# Patient Record
Sex: Male | Born: 1985 | Race: Black or African American | Hispanic: No | Marital: Single | Smoking: Former smoker
Health system: Southern US, Community
[De-identification: ages and names within clinical notes are randomized; demographics above are authoritative.]

## PROBLEM LIST (undated history)

## (undated) HISTORY — PX: SKIN GRAFT: SHX250

---

## 2014-11-22 ENCOUNTER — Encounter (HOSPITAL_COMMUNITY): Payer: Self-pay | Admitting: Emergency Medicine

## 2014-11-22 ENCOUNTER — Emergency Department (HOSPITAL_COMMUNITY)
Admission: EM | Admit: 2014-11-22 | Discharge: 2014-11-22 | Disposition: A | Discharge status: Home / Self Care | Payer: BLUE CROSS/BLUE SHIELD | Attending: Emergency Medicine | Admitting: Emergency Medicine

## 2014-11-22 ENCOUNTER — Emergency Department (HOSPITAL_COMMUNITY): Payer: BLUE CROSS/BLUE SHIELD

## 2014-11-22 DIAGNOSIS — S61219A Laceration without foreign body of unspecified finger without damage to nail, initial encounter: Secondary | ICD-10-CM

## 2014-11-22 DIAGNOSIS — Y9389 Activity, other specified: Secondary | ICD-10-CM | POA: Diagnosis not present

## 2014-11-22 DIAGNOSIS — Y9289 Other specified places as the place of occurrence of the external cause: Secondary | ICD-10-CM | POA: Diagnosis not present

## 2014-11-22 DIAGNOSIS — S61217A Laceration without foreign body of left little finger without damage to nail, initial encounter: Secondary | ICD-10-CM | POA: Insufficient documentation

## 2014-11-22 DIAGNOSIS — W231XXA Caught, crushed, jammed, or pinched between stationary objects, initial encounter: Secondary | ICD-10-CM | POA: Insufficient documentation

## 2014-11-22 DIAGNOSIS — Z23 Encounter for immunization: Secondary | ICD-10-CM | POA: Insufficient documentation

## 2014-11-22 DIAGNOSIS — S6992XA Unspecified injury of left wrist, hand and finger(s), initial encounter: Secondary | ICD-10-CM | POA: Diagnosis present

## 2014-11-22 DIAGNOSIS — Z720 Tobacco use: Secondary | ICD-10-CM | POA: Diagnosis not present

## 2014-11-22 DIAGNOSIS — Y998 Other external cause status: Secondary | ICD-10-CM | POA: Insufficient documentation

## 2014-11-22 MED ORDER — LIDOCAINE HCL 1 % IJ SOLN
INTRAMUSCULAR | Status: AC
  Administered 2014-11-22: 20 mL via INTRADERMAL
  Filled 2014-11-22: qty 20

## 2014-11-22 MED ORDER — CEPHALEXIN 500 MG PO CAPS: 500.0000 mg | ORAL_CAPSULE | Freq: Four times a day (QID) | ORAL | Status: DC

## 2014-11-22 MED ORDER — TETANUS-DIPHTH-ACELL PERTUSSIS 5-2.5-18.5 LF-MCG/0.5 IM SUSP
0.5000 mL | Freq: Once | INTRAMUSCULAR | Status: AC
  Administered 2014-11-22: 0.5 mL via INTRAMUSCULAR
  Filled 2014-11-22: qty 0.5

## 2014-11-22 MED ORDER — LIDOCAINE HCL 1 % IJ SOLN
30.0000 mL | Freq: Once | INTRAMUSCULAR | Status: AC
  Administered 2014-11-22: 20 mL via INTRADERMAL

## 2014-11-22 NOTE — Discharge Instructions (Signed)

## 2014-11-22 NOTE — ED Notes (Signed)
Pt states he slammed his pinky finger on his left hand in a door  Pt states it busted it open and now he has an open wound noted  Pt has finger bandaged up  Did not unwrap in triage

## 2014-11-22 NOTE — ED Provider Notes (Signed)
CSN: [id number]     Arrival date & time 11/22/14  0356 History   First MD Initiated Contact with Patient 11/22/14 0505     Chief Complaint  Patient presents with  . Finger Injury     (Consider location/radiation/quality/duration/timing/severity/associated sxs/prior Treatment) Patient is a 29 y.o. male presenting with hand pain.  Hand Pain This is a new problem. The current episode started 1 to 2 hours ago. The problem occurs constantly. The problem has not changed since onset.Pertinent negatives include no chest pain, no abdominal pain, no headaches and no shortness of breath. The symptoms are aggravated by bending (touching). Nothing relieves the symptoms.    History reviewed. No pertinent past medical history. Past Surgical History  Procedure Laterality Date  . Skin graft     Family History  Problem Relation Age of Onset  . Diabetes Other   . Hypertension Other   . CAD Other    History  Substance Use Topics  . Smoking status: Current Every Day Smoker    Types: Cigarettes  . Smokeless tobacco: Not on file  . Alcohol Use: Yes     Comment: rare    Review of Systems  Respiratory: Negative for shortness of breath.   Cardiovascular: Negative for chest pain.  Gastrointestinal: Negative for abdominal pain.  Neurological: Negative for headaches.  All other systems reviewed and are negative.     Allergies  Review of patient's allergies indicates no known allergies.  Home Medications   Prior to Admission medications   Medication Sig Start Date End Date Taking? Authorizing Provider  cephALEXin (KEFLEX) 500 MG capsule Take 1 capsule (500 mg total) by mouth 4 (four) times daily. 11/22/14   Mirian MoMatthew Gentry, MD   BP 148/97 mmHg  Pulse 80  Temp(Src) 98.8 F (37.1 C) (Oral)  Resp 18  Ht 6\' 1"  (1.854 m)  Wt 280 lb (127.007 kg)  BMI 36.95 kg/m2  SpO2 100% Physical Exam  Constitutional: He is oriented to person, place, and time. He appears well-developed and well-nourished.   HENT:  Head: Normocephalic and atraumatic.  Eyes: Conjunctivae and EOM are normal.  Neck: Normal range of motion. Neck supple.  Cardiovascular: Normal rate, regular rhythm and normal heart sounds.   Pulmonary/Chest: Effort normal and breath sounds normal. No respiratory distress.  Abdominal: He exhibits no distension. There is no tenderness. There is no rebound and no guarding.  Musculoskeletal: Normal range of motion.       Left hand: He exhibits laceration (l fifth distal phalanx palmar aspect 1 cm).  Neurological: He is alert and oriented to person, place, and time.  Skin: Skin is warm and dry.  Vitals reviewed.   ED Course  LACERATION REPAIR Date/Time: 11/22/2014 6:56 AM Performed by: Mirian MoGENTRY, MATTHEW Authorized by: Mirian MoGENTRY, MATTHEW Consent: Verbal consent obtained. Body area: upper extremity Location details: left small finger Laceration length: 1 cm Tendon involvement: none Nerve involvement: none Vascular damage: no Anesthesia: digital block Local anesthetic: lidocaine 1% without epinephrine Irrigation solution: saline Irrigation method: syringe Amount of cleaning: standard Debridement: none Degree of undermining: none Skin closure: 3-0 Prolene Number of sutures: 3 Technique: simple Approximation: close Approximation difficulty: simple Patient tolerance: Patient tolerated the procedure well with no immediate complications   (including critical care time) Labs Review Labs Reviewed - No data to display  Imaging Review Dg Finger Little Left  11/22/2014   CLINICAL DATA:  Closed left fifth finger in car door, with laceration at the distal tuft. Initial encounter.  EXAM: LEFT LITTLE  FINGER 2+V  COMPARISON:  None.  FINDINGS: There is no evidence of fracture or dislocation. Visualized osseous structures appear grossly intact. Visualized joint spaces are preserved. Soft tissue swelling is noted at the distal aspect of the fifth digit. No radiopaque foreign bodies are seen.   IMPRESSION: No evidence of fracture or dislocation.   Electronically Signed   By: Roanna Raider M.D.   On: 11/22/2014 05:13     EKG Interpretation None      MDM   Final diagnoses:  Finger laceration, initial encounter    29 y.o. male without pertinent PMH presents with L 5th finger injury as above from slamming finger in his door.  Physical exam as above, no tendinous, vascular, or neurologic dysfunction.  Repaired uneventfully.  Standard return precautions given.    I have reviewed all laboratory and imaging studies if ordered as above  1. Finger laceration, initial encounter         Mirian Mo, MD 11/22/14 [phone]

## 2017-02-05 ENCOUNTER — Encounter: Payer: Self-pay | Admitting: Emergency Medicine

## 2017-02-05 ENCOUNTER — Ambulatory Visit (INDEPENDENT_AMBULATORY_CARE_PROVIDER_SITE_OTHER): Payer: BLUE CROSS/BLUE SHIELD | Admitting: Emergency Medicine

## 2017-02-05 VITALS — BP 140/80 | HR 71 | Temp 98.8°F | Resp 16 | Ht 72.0 in | Wt 290.0 lb

## 2017-02-05 DIAGNOSIS — Z0000 Encounter for general adult medical examination without abnormal findings: Secondary | ICD-10-CM | POA: Diagnosis not present

## 2017-02-05 LAB — COMPREHENSIVE METABOLIC PANEL
ALT: 33 U/L (ref 9–46)
AST: 29 U/L (ref 10–40)
Albumin: 3.8 g/dL (ref 3.6–5.1)
Alkaline Phosphatase: 88 U/L (ref 40–115)
BILIRUBIN TOTAL: 0.4 mg/dL (ref 0.2–1.2)
BUN: 10 mg/dL (ref 7–25)
CALCIUM: 9.4 mg/dL (ref 8.6–10.3)
CO2: 24 mmol/L (ref 20–31)
Chloride: 104 mmol/L (ref 98–110)
Creat: 0.95 mg/dL (ref 0.60–1.35)
Glucose, Bld: 84 mg/dL (ref 65–99)
Potassium: 4.5 mmol/L (ref 3.5–5.3)
Sodium: 140 mmol/L (ref 135–146)
Total Protein: 6.1 g/dL (ref 6.1–8.1)

## 2017-02-05 LAB — CBC WITH DIFFERENTIAL/PLATELET
BASOS ABS: 66 {cells}/uL (ref 0–200)
Basophils Relative: 1 %
EOS ABS: 726 {cells}/uL — AB (ref 15–500)
Eosinophils Relative: 11 %
HEMATOCRIT: 44.8 % (ref 38.5–50.0)
HEMOGLOBIN: 15.1 g/dL (ref 13.2–17.1)
LYMPHS ABS: 2838 {cells}/uL (ref 850–3900)
Lymphocytes Relative: 43 %
MCH: 28.5 pg (ref 27.0–33.0)
MCHC: 33.7 g/dL (ref 32.0–36.0)
MCV: 84.7 fL (ref 80.0–100.0)
MONO ABS: 594 {cells}/uL (ref 200–950)
MPV: 9.6 fL (ref 7.5–12.5)
Monocytes Relative: 9 %
Neutro Abs: 2376 cells/uL (ref 1500–7800)
Neutrophils Relative %: 36 %
Platelets: 259 10*3/uL (ref 140–400)
RBC: 5.29 MIL/uL (ref 4.20–5.80)
RDW: 14.2 % (ref 11.0–15.0)
WBC: 6.6 10*3/uL (ref 3.8–10.8)

## 2017-02-05 LAB — LIPID PANEL
CHOL/HDL RATIO: 7.4 ratio — AB (ref <5.0)
Cholesterol: 245 mg/dL — ABNORMAL HIGH (ref <200)
HDL: 33 mg/dL — ABNORMAL LOW (ref >40)
LDL Cholesterol: 134 mg/dL — ABNORMAL HIGH (ref <100)
Triglycerides: 388 mg/dL — ABNORMAL HIGH (ref <150)
VLDL: 78 mg/dL — ABNORMAL HIGH (ref <30)

## 2017-02-05 LAB — TSH: TSH: 1.15 mIU/L (ref 0.40–4.50)

## 2017-02-05 NOTE — Patient Instructions (Addendum)
   IF you received an x-ray today, you will receive an invoice from [facility]. Please contact [facility] at [phone] with questions or concerns regarding your invoice.   IF you received labwork today, you will receive an invoice from [facility]. Please contact [facility] at [phone] with questions or concerns regarding your invoice.   Our billing staff will not be able to assist you with questions regarding bills from these companies.  You will be contacted with the lab results as soon as they are available. The fastest way to get your results is to activate your My Chart account. Instructions are located on the last page of this paperwork. If you have not heard from us regarding the results in 2 weeks, please contact this office.      Health Maintenance, Male A healthy lifestyle and preventive care is important for your health and wellness. Ask your health care provider about what schedule of regular examinations is right for you. What should I know about weight and diet? Eat a Healthy Diet  Eat plenty of vegetables, fruits, whole grains, low-fat dairy products, and lean protein.  Do not eat a lot of foods high in solid fats, added sugars, or salt.  Maintain a Healthy Weight Regular exercise can help you achieve or maintain a healthy weight. You should:  Do at least 150 minutes of exercise each week. The exercise should increase your heart rate and make you sweat (moderate-intensity exercise).  Do strength-training exercises at least twice a week.  Watch Your Levels of Cholesterol and Blood Lipids  Have your blood tested for lipids and cholesterol every 5 years starting at 31 years of age. If you are at high risk for heart disease, you should start having your blood tested when you are 31 years old. You may need to have your cholesterol levels checked more often if: ? Your lipid or cholesterol levels are high. ? You are older than 31 years of age. ? You  are at high risk for heart disease.  What should I know about cancer screening? Many types of cancers can be detected early and may often be prevented. Lung Cancer  You should be screened every year for lung cancer if: ? You are a current smoker who has smoked for at least 30 years. ? You are a former smoker who has quit within the past 15 years.  Talk to your health care provider about your screening options, when you should start screening, and how often you should be screened.  Colorectal Cancer  Routine colorectal cancer screening usually begins at 31 years of age and should be repeated every 5-10 years until you are 31 years old. You may need to be screened more often if early forms of precancerous polyps or small growths are found. Your health care provider may recommend screening at an earlier age if you have risk factors for colon cancer.  Your health care provider may recommend using home test kits to check for hidden blood in the stool.  A small camera at the end of a tube can be used to examine your colon (sigmoidoscopy or colonoscopy). This checks for the earliest forms of colorectal cancer.  Prostate and Testicular Cancer  Depending on your age and overall health, your health care provider may do certain tests to screen for prostate and testicular cancer.  Talk to your health care provider about any symptoms or concerns you have about testicular or prostate cancer.  Skin Cancer  Check your skin   from head to toe regularly.  Tell your health care provider about any new moles or changes in moles, especially if: ? There is a change in a mole's size, shape, or color. ? You have a mole that is larger than a pencil eraser.  Always use sunscreen. Apply sunscreen liberally and repeat throughout the day.  Protect yourself by wearing long sleeves, pants, a wide-brimmed hat, and sunglasses when outside.  What should I know about heart disease, diabetes, and high blood  pressure?  If you are 18-39 years of age, have your blood pressure checked every 3-5 years. If you are 40 years of age or older, have your blood pressure checked every year. You should have your blood pressure measured twice-once when you are at a hospital or clinic, and once when you are not at a hospital or clinic. Record the average of the two measurements. To check your blood pressure when you are not at a hospital or clinic, you can use: ? An automated blood pressure machine at a pharmacy. ? A home blood pressure monitor.  Talk to your health care provider about your target blood pressure.  If you are between 45-79 years old, ask your health care provider if you should take aspirin to prevent heart disease.  Have regular diabetes screenings by checking your fasting blood sugar level. ? If you are at a normal weight and have a low risk for diabetes, have this test once every three years after the age of 45. ? If you are overweight and have a high risk for diabetes, consider being tested at a younger age or more often.  A one-time screening for abdominal aortic aneurysm (AAA) by ultrasound is recommended for men aged 65-75 years who are current or former smokers. What should I know about preventing infection? Hepatitis B If you have a higher risk for hepatitis B, you should be screened for this virus. Talk with your health care provider to find out if you are at risk for hepatitis B infection. Hepatitis C Blood testing is recommended for:  Everyone born from 1945 through 1965.  Anyone with known risk factors for hepatitis C.  Sexually Transmitted Diseases (STDs)  You should be screened each year for STDs including gonorrhea and chlamydia if: ? You are sexually active and are younger than 31 years of age. ? You are older than 31 years of age and your health care provider tells you that you are at risk for this type of infection. ? Your sexual activity has changed since you were last  screened and you are at an increased risk for chlamydia or gonorrhea. Ask your health care provider if you are at risk.  Talk with your health care provider about whether you are at high risk of being infected with HIV. Your health care provider may recommend a prescription medicine to help prevent HIV infection.  What else can I do?  Schedule regular health, dental, and eye exams.  Stay current with your vaccines (immunizations).  Do not use any tobacco products, such as cigarettes, chewing tobacco, and e-cigarettes. If you need help quitting, ask your health care provider.  Limit alcohol intake to no more than 2 drinks per day. One drink equals 12 ounces of beer, 5 ounces of wine, or 1 ounces of hard liquor.  Do not use street drugs.  Do not share needles.  Ask your health care provider for help if you need support or information about quitting drugs.  Tell your health care   provider if you often feel depressed.  Tell your health care provider if you have ever been abused or do not feel safe at home. This information is not intended to replace advice given to you by your health care provider. Make sure you discuss any questions you have with your health care provider. Document Released: 01/11/2008 Document Revised: 03/13/2016 Document Reviewed: 04/18/2015 Elsevier Interactive Patient Education  2018 [employer]  American Heart Association (AHA) Exercise Recommendation  Being physically active is important to prevent heart disease and stroke, the nation's No. 1and No. 5killers. To improve overall cardiovascular health, we suggest at least 150 minutes per week of moderate exercise or 75 minutes per week of vigorous exercise (or a combination of moderate and vigorous activity). Thirty minutes a day, five times a week is an easy goal to remember. You will also experience benefits even if you divide your time into two or three segments of 10 to 15 minutes per day.  For people who would  benefit from lowering their blood pressure or cholesterol, we recommend 40 minutes of aerobic exercise of moderate to vigorous intensity three to four times a week to lower the risk for heart attack and stroke.  Physical activity is anything that makes you move your body and burn calories.  This includes things like climbing stairs or playing sports. Aerobic exercises benefit your heart, and include walking, jogging, swimming or biking. Strength and stretching exercises are best for overall stamina and flexibility.  The simplest, positive change you can make to effectively improve your heart health is to start walking. It's enjoyable, free, easy, social and great exercise. A walking program is flexible and boasts high success rates because people can stick with it. It's easy for walking to become a regular and satisfying part of life.   For Overall Cardiovascular Health:  At least 30 minutes of moderate-intensity aerobic activity at least 5 days per week for a total of 150  OR   At least 25 minutes of vigorous aerobic activity at least 3 days per week for a total of 75 minutes; or a combination of moderate- and vigorous-intensity aerobic activity  AND   Moderate- to high-intensity muscle-strengthening activity at least 2 days per week for additional health benefits.  For Lowering Blood Pressure and Cholesterol  An average 40 minutes of moderate- to vigorous-intensity aerobic activity 3 or 4 times per week  What if I can't make it to the time goal? Something is always better than nothing! And everyone has to start somewhere. Even if you've been sedentary for years, today is the day you can begin to make healthy changes in your life. If you don't think you'll make it for 30 or 40 minutes, set a reachable goal for today. You can work up toward your overall goal by increasing your time as you get stronger. Don't let all-or-nothing thinking rob you of doing what you can every day.   Source:http://www.heart.org    

## 2017-02-05 NOTE — Progress Notes (Signed)
Celesta Gentile 31 y.o.   Chief Complaint  Patient presents with  . Annual Exam    HISTORY OF PRESENT ILLNESS: This is a 31 y.o. male here for annual exam; no complaints or medical concerns.  HPI   Prior to Admission medications   Not on File    Not on File  Patient Active Problem List   Diagnosis Date Noted  . Routine general medical examination at a health care facility 02/05/2017    No past medical history on file.  Past Surgical History:  Procedure Laterality Date  . SKIN GRAFT      Social History   Social History  . Marital status: Single    Spouse name: N/A  . Number of children: N/A  . Years of education: N/A   Occupational History  . Not on file.   Social History Main Topics  . Smoking status: Current Every Day Smoker    Packs/day: 1.00    Years: 8.00    Types: Cigarettes  . Smokeless tobacco: Never Used  . Alcohol use Yes     Comment: rare  . Drug use: Yes    Types: Marijuana     Comment: occ  . Sexual activity: Not on file   Other Topics Concern  . Not on file   Social History Narrative  . No narrative on file    Family History  Problem Relation Age of Onset  . Diabetes Other   . Hypertension Other   . CAD Other   . Diabetes Father   . Heart disease Father   . Hyperlipidemia Father   . Diabetes Maternal Grandmother   . Heart disease Maternal Grandmother   . Hyperlipidemia Maternal Grandmother   . Heart disease Paternal Grandfather      Review of Systems  Constitutional: Negative.  Negative for chills and fever.  HENT: Negative.  Negative for hearing loss, nosebleeds and sore throat.   Eyes: Negative.  Negative for blurred vision and double vision.  Respiratory: Negative.  Negative for cough and shortness of breath.   Cardiovascular: Negative.  Negative for chest pain, palpitations and leg swelling.  Gastrointestinal: Negative.  Negative for abdominal pain, diarrhea, nausea and vomiting.  Genitourinary:  Negative.  Negative for dysuria and hematuria.  Musculoskeletal: Negative.   Skin: Negative.  Negative for rash.  Neurological: Negative.  Negative for dizziness and headaches.  Endo/Heme/Allergies: Negative.   All other systems reviewed and are negative.  Vitals:   02/05/17 1211 02/05/17 1310  BP: (!) 170/99 140/80  Pulse: 71   Resp: 16   Temp: 98.8 F (37.1 C)     Physical Exam  Constitutional: He is oriented to person, place, and time. He appears well-developed and well-nourished.  HENT:  Head: Normocephalic and atraumatic.  Eyes: Conjunctivae and EOM are normal. Pupils are equal, round, and reactive to light.  Neck: Normal range of motion. Neck supple. No JVD present. No thyromegaly present.  Cardiovascular: Normal rate, regular rhythm, normal heart sounds and intact distal pulses.   Pulmonary/Chest: Effort normal and breath sounds normal.  Abdominal: Soft. Bowel sounds are normal. He exhibits no distension. There is no tenderness.  Musculoskeletal: Normal range of motion.  Lymphadenopathy:    He has no cervical adenopathy.  Neurological: He is alert and oriented to person, place, and time. He displays normal reflexes. No sensory deficit. He exhibits normal muscle tone.  Skin: Skin is warm and dry. Capillary refill takes less than 2 seconds. No rash noted.  Psychiatric: He has a normal mood and affect. His behavior is normal.  Vitals reviewed.    ASSESSMENT & PLAN: Quinlin was seen today for annual exam.  Diagnoses and all orders for this visit:  Routine general medical examination at a health care facility -     Cancel: CBC with Differential -     Cancel: Comprehensive metabolic panel -     Cancel: Hemoglobin A1c -     Cancel: Lipid panel -     Cancel: PSA(Must document that pt has been informed of limitations of PSA testing.) -     Cancel: TSH -     Cancel: HIV antibody -     CBC with Differential/Platelet -     Comprehensive metabolic panel -     Hemoglobin  A1c -     Lipid panel -     PSA -     TSH -     HIV antibody    Patient Instructions       IF you received an x-ray today, you will receive an invoice from [facility]. Please contact [facility] at [phone] with questions or concerns regarding your invoice.   IF you received labwork today, you will receive an invoice from [facility]. Please contact [facility] at [phone] with questions or concerns regarding your invoice.   Our billing staff will not be able to assist you with questions regarding bills from these companies.  You will be contacted with the lab results as soon as they are available. The fastest way to get your results is to activate your My Chart account. Instructions are located on the last page of this paperwork. If you have not heard from [place] regarding the results in 2 weeks, please contact this office.        Health Maintenance, Male A healthy lifestyle and preventive care is important for your health and wellness. Ask your health care provider about what schedule of regular examinations is right for you. What should I know about weight and diet? Eat a Healthy Diet  Eat plenty of vegetables, fruits, whole grains, low-fat dairy products, and lean protein.  Do not eat a lot of foods high in solid fats, added sugars, or salt.  Maintain a Healthy Weight Regular exercise can help you achieve or maintain a healthy weight. You should:  Do at least 150 minutes of exercise each week. The exercise should increase your heart rate and make you sweat (moderate-intensity exercise).  Do strength-training exercises at least twice a week.  Watch Your Levels of Cholesterol and Blood Lipids  Have your blood tested for lipids and cholesterol every 5 years starting at 31 years of age. If you are at high risk for heart disease, you should start having your blood tested when you are 31 years old. You may need to have your cholesterol levels checked more  often if: ? Your lipid or cholesterol levels are high. ? You are older than 31 years of age. ? You are at high risk for heart disease.  What should I know about cancer screening? Many types of cancers can be detected early and may often be prevented. Lung Cancer  You should be screened every year for lung cancer if: ? You are a current smoker who has smoked for at least 30 years. ? You are a former smoker who has quit within the past 15 years.  Talk to your health care provider about your screening options, when you should start screening, and how  often you should be screened.  Colorectal Cancer  Routine colorectal cancer screening usually begins at 31 years of age and should be repeated every 5-10 years until you are 31 years old. You may need to be screened more often if early forms of precancerous polyps or small growths are found. Your health care provider may recommend screening at an earlier age if you have risk factors for colon cancer.  Your health care provider may recommend using home test kits to check for hidden blood in the stool.  A small camera at the end of a tube can be used to examine your colon (sigmoidoscopy or colonoscopy). This checks for the earliest forms of colorectal cancer.  Prostate and Testicular Cancer  Depending on your age and overall health, your health care provider may do certain tests to screen for prostate and testicular cancer.  Talk to your health care provider about any symptoms or concerns you have about testicular or prostate cancer.  Skin Cancer  Check your skin from head to toe regularly.  Tell your health care provider about any new moles or changes in moles, especially if: ? There is a change in a mole's size, shape, or color. ? You have a mole that is larger than a pencil eraser.  Always use sunscreen. Apply sunscreen liberally and repeat throughout the day.  Protect yourself by wearing long sleeves, pants, a wide-brimmed hat, and  sunglasses when outside.  What should I know about heart disease, diabetes, and high blood pressure?  If you are 71-49 years of age, have your blood pressure checked every 3-5 years. If you are 62 years of age or older, have your blood pressure checked every year. You should have your blood pressure measured twice-once when you are at a hospital or clinic, and once when you are not at a hospital or clinic. Record the average of the two measurements. To check your blood pressure when you are not at a hospital or clinic, you can use: ? An automated blood pressure machine at a pharmacy. ? A home blood pressure monitor.  Talk to your health care provider about your target blood pressure.  If you are between 75-72 years old, ask your health care provider if you should take aspirin to prevent heart disease.  Have regular diabetes screenings by checking your fasting blood sugar level. ? If you are at a normal weight and have a low risk for diabetes, have this test once every three years after the age of 68. ? If you are overweight and have a high risk for diabetes, consider being tested at a younger age or more often.  A one-time screening for abdominal aortic aneurysm (AAA) by ultrasound is recommended for men aged 65-75 years who are current or former smokers. What should I know about preventing infection? Hepatitis B If you have a higher risk for hepatitis B, you should be screened for this virus. Talk with your health care provider to find out if you are at risk for hepatitis B infection. Hepatitis C Blood testing is recommended for:  Everyone born from [birth year] through 1965.  Anyone with known risk factors for hepatitis C.  Sexually Transmitted Diseases (STDs)  You should be screened each year for STDs including gonorrhea and chlamydia if: ? You are sexually active and are younger than 31 years of age. ? You are older than 31 years of age and your health care provider tells you that you are  at risk for this type of  infection. ? Your sexual activity has changed since you were last screened and you are at an increased risk for chlamydia or gonorrhea. Ask your health care provider if you are at risk.  Talk with your health care provider about whether you are at high risk of being infected with HIV. Your health care provider may recommend a prescription medicine to help prevent HIV infection.  What else can I do?  Schedule regular health, dental, and eye exams.  Stay current with your vaccines (immunizations).  Do not use any tobacco products, such as cigarettes, chewing tobacco, and e-cigarettes. If you need help quitting, ask your health care provider.  Limit alcohol intake to no more than 2 drinks per day. One drink equals 12 ounces of beer, 5 ounces of wine, or 1 ounces of hard liquor.  Do not use street drugs.  Do not share needles.  Ask your health care provider for help if you need support or information about quitting drugs.  Tell your health care provider if you often feel depressed.  Tell your health care provider if you have ever been abused or do not feel safe at home. This information is not intended to replace advice given to you by your health care provider. Make sure you discuss any questions you have with your health care provider. Document Released: 01/11/2008 Document Revised: 03/13/2016 Document Reviewed: 04/18/2015 Elsevier Interactive Patient Education  2018 [employer].  American Heart Association (AHA) Exercise Recommendation  Being physically active is important to prevent heart disease and stroke, the nation's No. 1and No. 5killers. To improve overall cardiovascular health, we suggest at least 150 minutes per week of moderate exercise or 75 minutes per week of vigorous exercise (or a combination of moderate and vigorous activity). Thirty minutes a day, five times a week is an easy goal to remember. You will also experience benefits even if you  divide your time into two or three segments of 10 to 15 minutes per day.  For people who would benefit from lowering their blood pressure or cholesterol, we recommend 40 minutes of aerobic exercise of moderate to vigorous intensity three to four times a week to lower the risk for heart attack and stroke.  Physical activity is anything that makes you move your body and burn calories.  This includes things like climbing stairs or playing sports. Aerobic exercises benefit your heart, and include walking, jogging, swimming or biking. Strength and stretching exercises are best for overall stamina and flexibility.  The simplest, positive change you can make to effectively improve your heart health is to start walking. It's enjoyable, free, easy, social and great exercise. A walking program is flexible and boasts high success rates because people can stick with it. It's easy for walking to become a regular and satisfying part of life.   For Overall Cardiovascular Health:  At least 30 minutes of moderate-intensity aerobic activity at least 5 days per week for a total of 150  OR   At least 25 minutes of vigorous aerobic activity at least 3 days per week for a total of 75 minutes; or a combination of moderate- and vigorous-intensity aerobic activity  AND   Moderate- to high-intensity muscle-strengthening activity at least 2 days per week for additional health benefits.  For Lowering Blood Pressure and Cholesterol  An average 40 minutes of moderate- to vigorous-intensity aerobic activity 3 or 4 times per week  What if I can't make it to the time goal? Something is always better than nothing!  And everyone has to start somewhere. Even if you've been sedentary for years, today is the day you can begin to make healthy changes in your life. If you don't think you'll make it for 30 or 40 minutes, set a reachable goal for today. You can work up toward your overall goal by increasing your time as you get  stronger. Don't let all-or-nothing thinking rob you of doing what you can every day.  Source:http://www.heart.[place]        Jasmia Angst, MD [facility]

## 2017-02-06 ENCOUNTER — Telehealth: Payer: Self-pay | Admitting: *Deleted

## 2017-02-06 ENCOUNTER — Other Ambulatory Visit: Payer: Self-pay | Admitting: Emergency Medicine

## 2017-02-06 LAB — HEMOGLOBIN A1C
Hgb A1c MFr Bld: 5.9 % — ABNORMAL HIGH (ref <5.7)
Mean Plasma Glucose: 123 mg/dL

## 2017-02-06 LAB — HIV ANTIBODY (ROUTINE TESTING W REFLEX): HIV: NONREACTIVE

## 2017-02-06 LAB — PSA: PSA: 0.3 ng/mL (ref <=4.0)

## 2017-02-06 MED ORDER — PRAVASTATIN SODIUM 40 MG PO TABS: 40.0000 mg | ORAL_TABLET | Freq: Every day | ORAL | 3 refills | Status: DC

## 2017-02-06 MED ORDER — PRAVASTATIN SODIUM 40 MG PO TABS: 40.0000 mg | ORAL_TABLET | Freq: Every day | ORAL | 3 refills | Status: AC

## 2017-02-06 NOTE — Telephone Encounter (Signed)
Called patient advised RX for pravastatin 40 mg [facility]

## 2017-02-06 NOTE — Addendum Note (Signed)
Addended by: Evie LacksSAGARDIA, Jadien Lehigh J on: 02/06/2017 11:23 AM   Modules accepted: Orders

## 2017-12-24 ENCOUNTER — Ambulatory Visit
Admission: RE | Admit: 2017-12-24 | Discharge: 2017-12-24 | Disposition: A | Discharge status: Home / Self Care | Payer: 59 | Source: Ambulatory Visit | Attending: Physical Medicine and Rehabilitation | Admitting: Physical Medicine and Rehabilitation

## 2017-12-24 ENCOUNTER — Other Ambulatory Visit: Payer: Self-pay | Admitting: Physical Medicine and Rehabilitation

## 2017-12-24 DIAGNOSIS — E871 Hypo-osmolality and hyponatremia: Secondary | ICD-10-CM

## 2018-11-12 IMAGING — CR DG CHEST 1V
1 series · 1 of 1 positions shown · non-contrast
Comparison: None.

CLINICAL DATA: Sodium deficiency

EXAM:
CHEST  1 VIEW

[w chest pa]
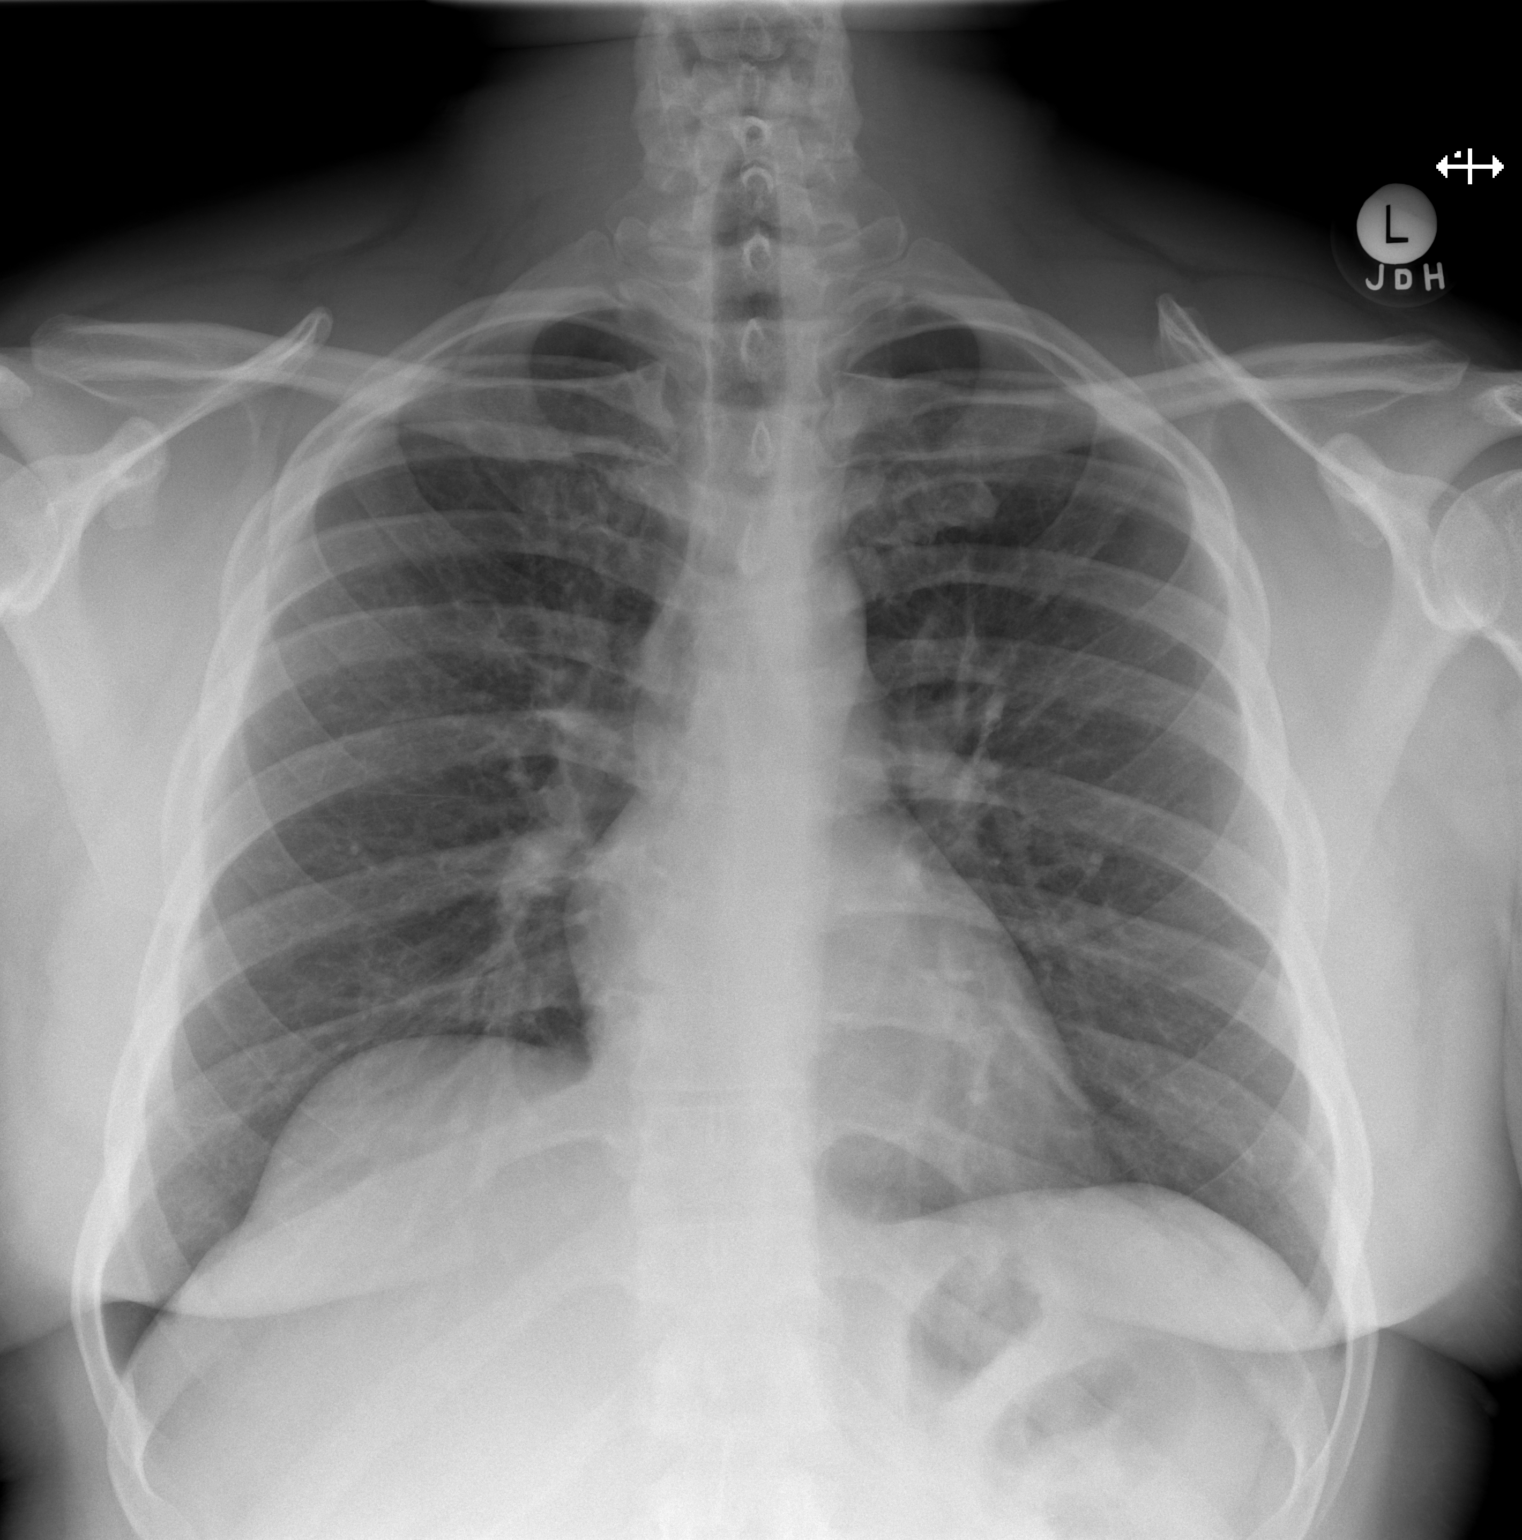

[1 of 1 positions shown; findings below may reference images not displayed]

FINDINGS: The heart size and mediastinal contours are within normal limits.
Both lungs are clear. The visualized skeletal structures are
unremarkable.
IMPRESSION: No active disease.

## 2020-01-10 ENCOUNTER — Ambulatory Visit: Payer: Self-pay

## 2020-01-10 ENCOUNTER — Other Ambulatory Visit: Payer: Self-pay

## 2020-01-10 ENCOUNTER — Other Ambulatory Visit: Payer: Self-pay | Admitting: Family Medicine

## 2020-01-10 DIAGNOSIS — M25512 Pain in left shoulder: Secondary | ICD-10-CM

## 2020-06-26 ENCOUNTER — Ambulatory Visit: Payer: Self-pay

## 2020-06-26 NOTE — Telephone Encounter (Signed)
BP noted this am: 187/"?" Rechecked BP 211/139-5 minutes before call. Woke up with headache this am. Was coughing all night. Pt seen at [facility] and BP was elevated there.   Pt is not on any meds for HTN and and has had headache intermittently  for months. Denies any weakness, numbness, any vision changes, chest pain or dizziness. [facility] advised pt to call back with BP reading and they will prescribe med for HTN. Advised pt to get PCP. [facility] number given to pt. Care advice given to pt and pt verbalized understanding.  Reason for Disposition . [1] Systolic BP  >= 200 OR Diastolic >= 120  AND [2] having NO cardiac or neurologic symptoms    H/o intermittent headaches- none at time of call took Excedrin migraine  Answer Assessment - Initial Assessment Questions 1. BLOOD PRESSURE: "What is the blood pressure?" "Did you take at least two measurements 5 minutes apart?"     211/139 2. ONSET: "When did you take your blood pressure?"     1010 3. HOW: "How did you obtain the blood pressure?" (e.g., visiting nurse, automatic home BP monitor)     Auto hoe BP cuff 4. HISTORY: "Do you have a history of high blood pressure?"     yes 5. MEDICATIONS: "Are you taking any medications for blood pressure?" "Have you missed any doses recently?"     no 6. OTHER SYMPTOMS: "Do you have any symptoms?" (e.g., headache, chest pain, blurred vision, difficulty breathing, weakness)     Fatigue, heachache 7. PREGNANCY: "Is there any chance you are pregnant?" "When was your last menstrual period?"     n/a  Protocols used: BLOOD PRESSURE - HIGH-A-AH

## 2020-07-03 DIAGNOSIS — I152 Hypertension secondary to endocrine disorders: Secondary | ICD-10-CM | POA: Insufficient documentation

## 2020-07-05 DIAGNOSIS — E1169 Type 2 diabetes mellitus with other specified complication: Secondary | ICD-10-CM | POA: Insufficient documentation

## 2020-07-10 DIAGNOSIS — E1165 Type 2 diabetes mellitus with hyperglycemia: Secondary | ICD-10-CM | POA: Insufficient documentation

## 2020-07-10 DIAGNOSIS — E559 Vitamin D deficiency, unspecified: Secondary | ICD-10-CM | POA: Insufficient documentation

## 2020-11-28 IMAGING — DX DG SHOULDER 2+V*L*
3 series · 3 of 3 positions shown · non-contrast
Comparison: None.

CLINICAL DATA: 33-year-old male left shoulder pain, strain.

EXAM:
LEFT SHOULDER - 2+ VIEW

[shoulder ap]
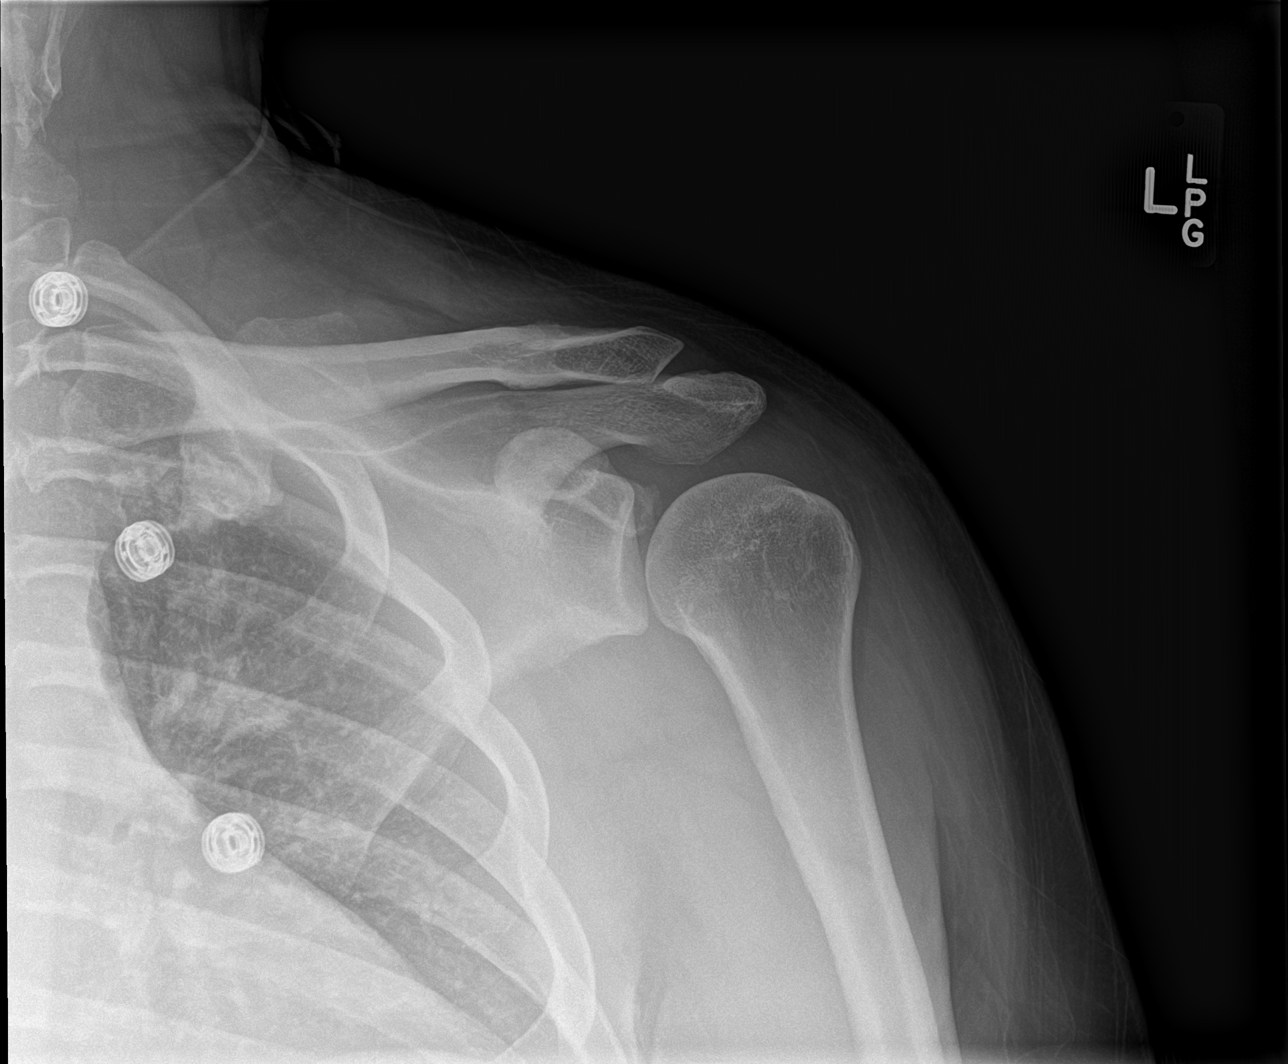

[shoulder y-view]
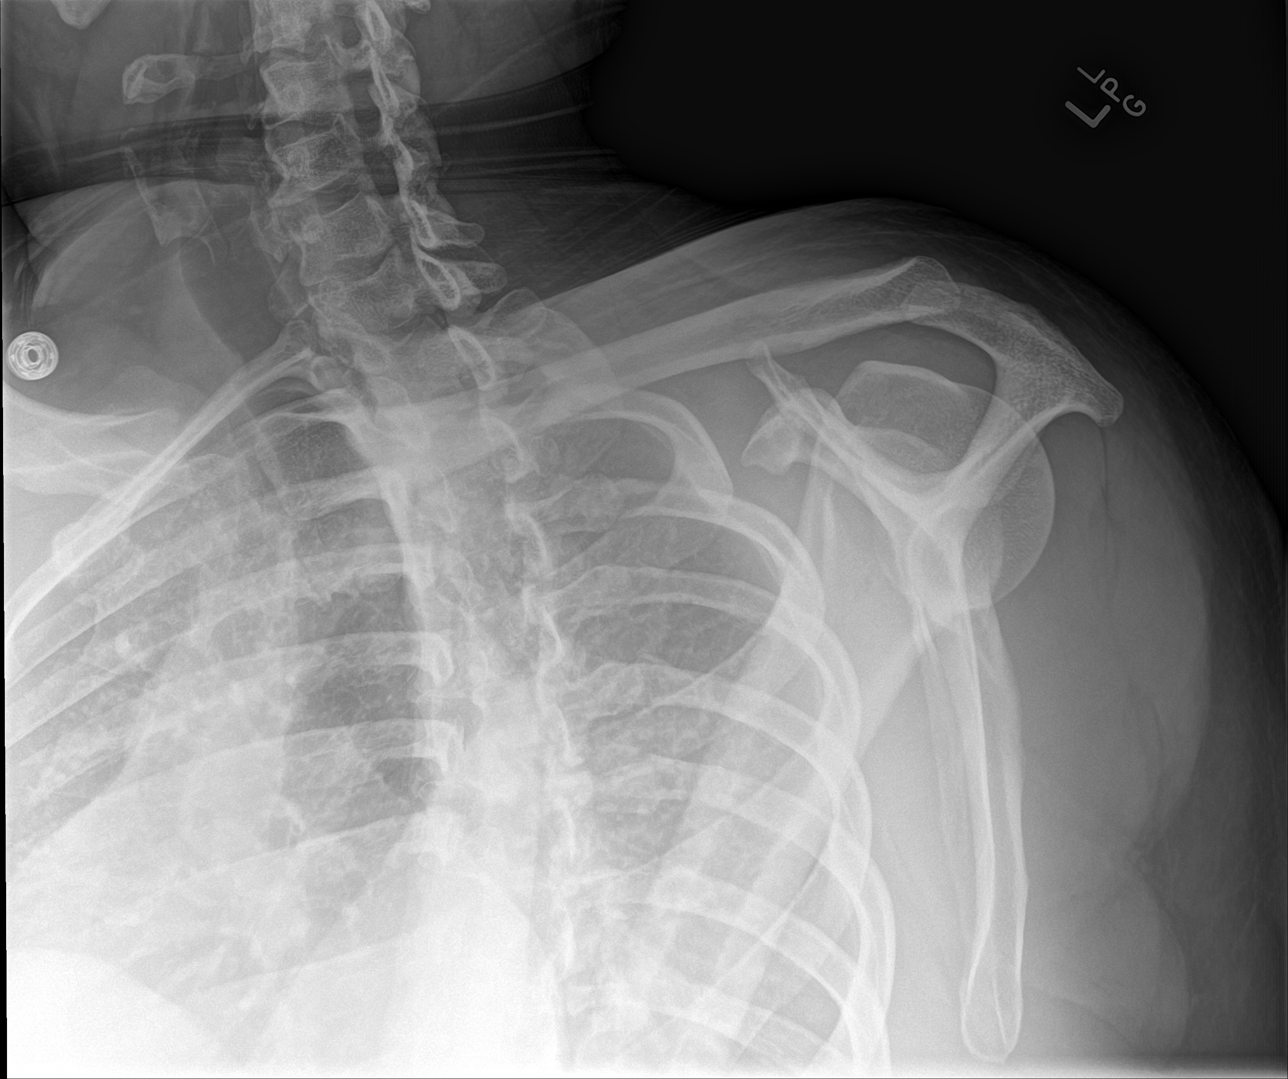

[shoulder axial]
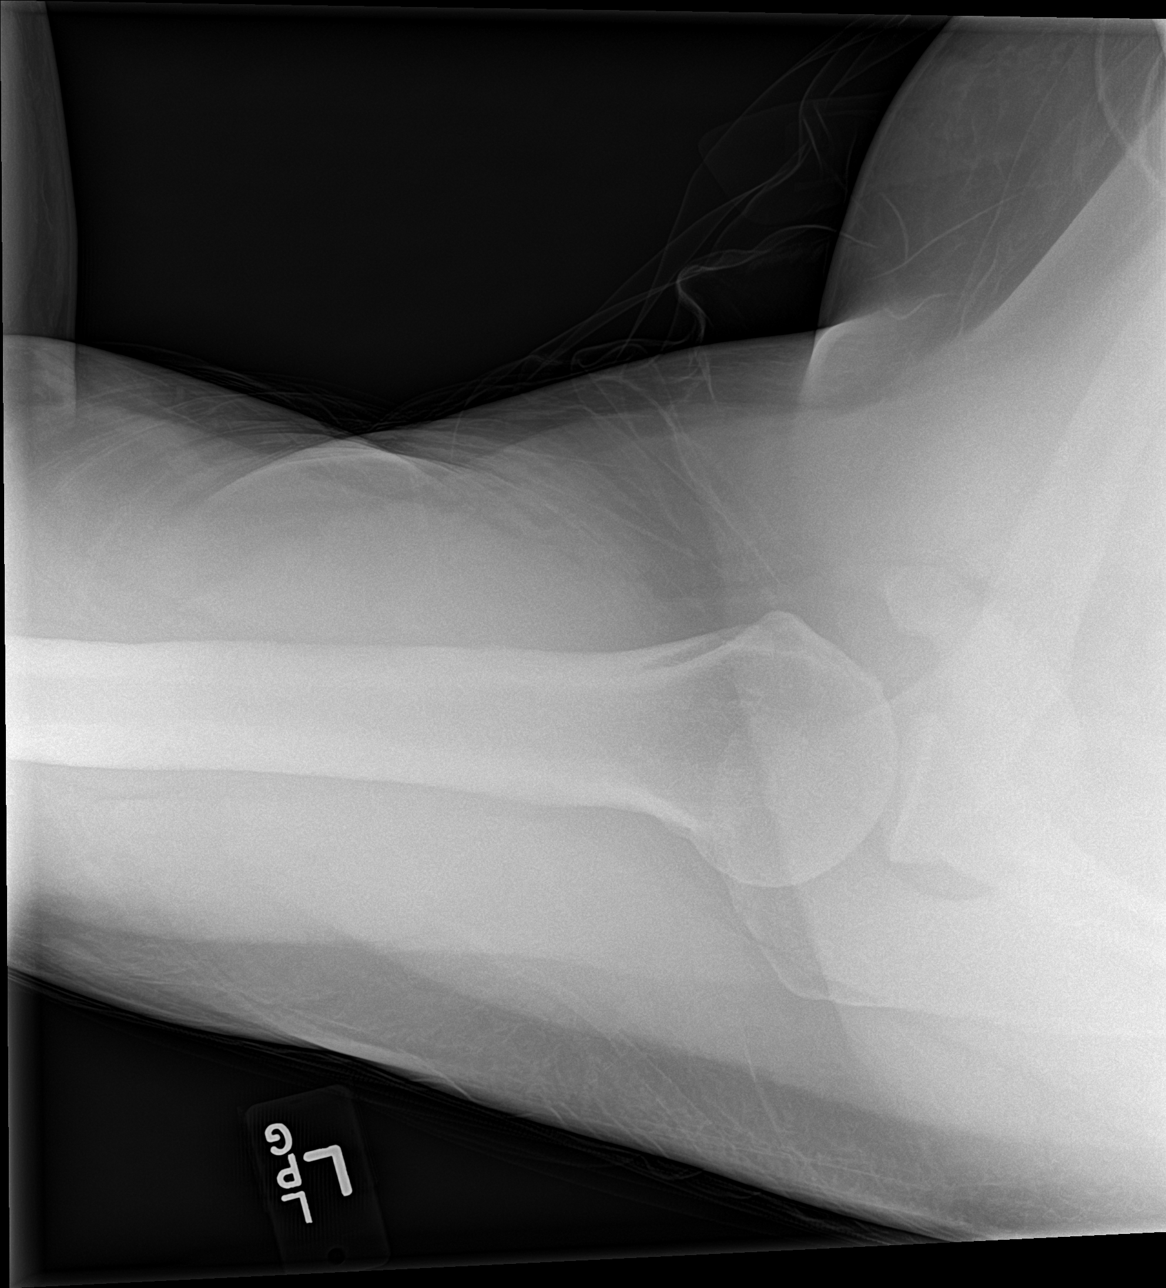

[3 of 3 positions shown; findings below may reference images not displayed]

FINDINGS: Bone mineralization is within normal limits. There is no evidence of
fracture or dislocation. There is no evidence of arthropathy or
other focal bone abnormality. Negative visible left ribs and chest.
IMPRESSION: Negative.

## 2022-11-01 ENCOUNTER — Institutional Professional Consult (permissible substitution): Payer: 59 | Admitting: Primary Care

## 2022-11-22 ENCOUNTER — Ambulatory Visit (INDEPENDENT_AMBULATORY_CARE_PROVIDER_SITE_OTHER): Payer: 59 | Admitting: Primary Care

## 2022-11-22 ENCOUNTER — Encounter: Payer: Self-pay | Admitting: Primary Care

## 2022-11-22 VITALS — BP 134/84 | HR 82 | Temp 99.2°F | Ht 73.0 in | Wt 290.6 lb

## 2022-11-22 DIAGNOSIS — R0683 Snoring: Secondary | ICD-10-CM

## 2022-11-22 NOTE — Assessment & Plan Note (Signed)
-   Patient has symptoms of loud snoring and witnessed apnea.  Epworth score is 10.  BMI 38.  Concern patient has underlying obstructive sleep apnea, needs home sleep study to evaluate.  We reviewed risks of untreated sleep apnea including cardiac arrhythmias, pulmonary hypertension, diabetes and stroke.  We discussed treatment options including weight loss, oral appliance, CPAP therapy or referral to ENT for possible surgical options.  Encourage side sleeping position.  Advised against alcohol use prior to bedtime as this can worsen sleep apnea.  Follow-up 1 to 2 weeks after sleep study review results and treatment options if needed.

## 2022-11-22 NOTE — Patient Instructions (Addendum)
Sleep apnea is defined as period of 10 seconds or longer when you stop breathing at night. This can happen multiple times a night. Dx sleep apnea is when this occurs more than 5 times an hour.    Mild OSA 5-15 apneic events an hour Moderate OSA 15-30 apneic events an hour Severe OSA > 30 apneic events an hour   Untreated sleep apnea puts you at higher risk for cardiac arrhythmias, pulmonary HTN, stroke and diabetes  Treatment options include weight loss, side sleeping position, oral appliance, CPAP therapy or referral to ENT for possible surgical options    Recommendations: Focus on side sleeping position or elevate head with wedge pillow 30 degrees Work on weight loss efforts if able  Do not drive if experiencing excessive daytime sleepiness of fatigue    Orders: Home sleep study re: loud snoring    Follow-up: Please call to schedule follow-up 1-2 weeks after completing home sleep study to review results and treatment if needed (can be virtual)  Sleep Apnea Sleep apnea affects breathing during sleep. It causes breathing to stop for 10 seconds or more, or to become shallow. People with sleep apnea usually snore loudly. It can also increase the risk of: Heart attack. Stroke. Being very overweight (obese). Diabetes. Heart failure. Irregular heartbeat. High blood pressure. The goal of treatment is to help you breathe normally again. What are the causes?  The most common cause of this condition is a collapsed or blocked airway. There are three kinds of sleep apnea: Obstructive sleep apnea. This is caused by a blocked or collapsed airway. Central sleep apnea. This happens when the brain does not send the right signals to the muscles that control breathing. Mixed sleep apnea. This is a combination of obstructive and central sleep apnea. What increases the risk? Being overweight. Smoking. Having a small airway. Being older. Being male. Drinking alcohol. Taking medicines to  calm yourself (sedatives or tranquilizers). Having family members with the condition. Having a tongue or tonsils that are larger than normal. What are the signs or symptoms? Trouble staying asleep. Loud snoring. Headaches in the morning. Waking up gasping. Dry mouth or sore throat in the morning. Being sleepy or tired during the day. If you are sleepy or tired during the day, you may also: Not be able to focus your mind (concentrate). Forget things. Get angry a lot and have mood swings. Feel sad (depressed). Have changes in your personality. Have less interest in sex, if you are male. Be unable to have an erection, if you are male. How is this treated?  Sleeping on your side. Using a medicine to get rid of mucus in your nose (decongestant). Avoiding the use of alcohol, medicines to help you relax, or certain pain medicines (narcotics). Losing weight, if needed. Changing your diet. Quitting smoking. Using a machine to open your airway while you sleep, such as: An oral appliance. This is a mouthpiece that shifts your lower jaw forward. A CPAP device. This device blows air through a mask when you breathe out (exhale). An EPAP device. This has valves that you put in each nostril. A BIPAP device. This device blows air through a mask when you breathe in (inhale) and breathe out. Having surgery if other treatments do not work. Follow these instructions at home: Lifestyle Make changes that your doctor recommends. Eat a healthy diet. Lose weight if needed. Avoid alcohol, medicines to help you relax, and some pain medicines. Do not smoke or use any products that contain   nicotine or tobacco. If you need help quitting, ask your doctor. General instructions Take over-the-counter and prescription medicines only as told by your doctor. If you were given a machine to use while you sleep, use it only as told by your doctor. If you are having surgery, make sure to tell your doctor you have  sleep apnea. You may need to bring your device with you. Keep all follow-up visits. Contact a doctor if: The machine that you were given to use during sleep bothers you or does not seem to be working. You do not get better. You get worse. Get help right away if: Your chest hurts. You have trouble breathing in enough air. You have an uncomfortable feeling in your back, arms, or stomach. You have trouble talking. One side of your body feels weak. A part of your face is hanging down. These symptoms may be an emergency. Get help right away. Call your local emergency services (911 in the [place] Do not wait to see if the symptoms will go away. Do not drive yourself to the hospital. Summary This condition affects breathing during sleep. The most common cause is a collapsed or blocked airway. The goal of treatment is to help you breathe normally while you sleep. This information is not intended to replace advice given to you by your health care provider. Make sure you discuss any questions you have with your health care provider. Document Revised: 02/21/2021 Document Reviewed: 06/23/2020 Elsevier Patient Education  2023 [employer]   

## 2022-11-22 NOTE — Progress Notes (Signed)
@Patient  ID: Kevin Riddle, male    DOB: 02/13/1986, 37 y.o.   MRN: [id number]  Chief Complaint  Patient presents with   Consult    Sleep consult.  Loud snoring    Referring provider: Leonard Downing  HPI: 37 year old male, former smoker quit in 2018.  Past medical history significant for, hyperlipidemia, type 2 diabetes, vitamin D deficiency.  11/22/2022 Patient presents today for sleep consult. Accompanied by his wife. He has symptoms of loud snoring and witnessed apnea. He has never woken up gasping/choking at night. He works night shifts. Typical bedtime during the week is 9am. On weekends he goes to bed between 10pm-12am. He is not sure how long it takes him to fall asleep. He wakes up 2-3 times while sleeping. He starts his day at 3:30pm on weekdays. His weight fluctuates. He is currently on ozempic and lost weight. No previous sleep studies. No concern for narcolepsy, cataplexy or sleep walking.   Sleep questionnaire Symptoms-   snoring, stops breathing in sleep  Prior sleep study- none  Bedtime- 9am M-F; 10pm-midnight Friday-Sun  Time to fall asleep- unsure  Nocturnal awakenings- 2-3 times  Out of bed/start of day- 3:30pm M-Thur/Friday  Weight changes- up/down  Do you operate heavy machinery- no Do you currently wear CPAP- no Do you current wear oxygen- no  Epworth- 10   Allergies  Allergen Reactions   Hydrochlorothiazide     Other Reaction(s): Dizziness  Made him feel lightheaded when standing    Immunization History  Administered Date(s) Administered   Tdap 11/22/2014    No past medical history on file.  Tobacco History: Social History   Tobacco Use  Smoking Status Former   Packs/day: 1.00   Years: 17.00   Additional pack years: 0.00   Total pack years: 17.00   Types: Cigarettes   Quit date: 2018   Years since quitting: 6.3  Smokeless Tobacco Never   Counseling given: Not Answered   Outpatient Medications Prior to Visit   Medication Sig Dispense Refill   amLODipine (NORVASC) 10 MG tablet Take 10 mg by mouth daily.     Blood Glucose Monitoring Suppl (BLOOD GLUCOSE MONITOR SYSTEM) w/Device KIT by Does not apply route.     Cholecalciferol 1.25 MG (50000 UT) capsule Take 50,000 Units by mouth daily.     Glucose Blood (BLOOD GLUCOSE TEST STRIPS) STRP 1 each by Other route daily. One touch brand     Lancets MISC by Does not apply route.     OZEMPIC, 1 MG/DOSE, 4 MG/3ML SOPN Inject 1 mg into the skin once a week.     rosuvastatin (CRESTOR) 10 MG tablet Take 10 mg by mouth at bedtime.     telmisartan (MICARDIS) 80 MG tablet Take 80 mg by mouth daily.     pravastatin (PRAVACHOL) 40 MG tablet Take 1 tablet (40 mg total) by mouth daily. (Patient not taking: Reported on 11/22/2022) 90 tablet 3   No facility-administered medications prior to visit.    Review of Systems  Review of Systems  Constitutional: Negative.   HENT: Negative.    Respiratory: Negative.    Psychiatric/Behavioral:  Positive for sleep disturbance.    Physical Exam  BP 134/84 (BP Location: Left Arm, Patient Position: Sitting, Cuff Size: Large)   Pulse 82   Temp 99.2 F (37.3 C) (Oral)   Ht 6\' 1"  (1.854 m)   Wt 290 lb 9.6 oz (131.8 kg)   SpO2 98%   BMI 38.34 kg/m  Physical Exam Constitutional:      Appearance: Normal appearance. He is obese.  HENT:     Head: Normocephalic and atraumatic.     Mouth/Throat:     Mouth: Mucous membranes are moist.     Pharynx: Oropharynx is clear.     Comments: Mallampati class III Cardiovascular:     Rate and Rhythm: Normal rate and regular rhythm.  Pulmonary:     Effort: Pulmonary effort is normal.     Breath sounds: Normal breath sounds.  Musculoskeletal:        General: Normal range of motion.  Skin:    General: Skin is warm and dry.  Neurological:     General: No focal deficit present.     Mental Status: He is alert and oriented to person, place, and time. Mental status is at baseline.   Psychiatric:        Mood and Affect: Mood normal.        Behavior: Behavior normal.        Thought Content: Thought content normal.        Judgment: Judgment normal.      Lab Results:  CBC    Component Value Date/Time   WBC 6.6 02/05/2017 1401   RBC 5.29 02/05/2017 1401   HGB 15.1 02/05/2017 1401   HCT 44.8 02/05/2017 1401   PLT 259 02/05/2017 1401   MCV 84.7 02/05/2017 1401   MCH 28.5 02/05/2017 1401   MCHC 33.7 02/05/2017 1401   RDW 14.2 02/05/2017 1401   LYMPHSABS 2,838 02/05/2017 1401   MONOABS 594 02/05/2017 1401   EOSABS 726 (H) 02/05/2017 1401   BASOSABS 66 02/05/2017 1401    BMET    Component Value Date/Time   NA 140 02/05/2017 1401   K 4.5 02/05/2017 1401   CL 104 02/05/2017 1401   CO2 24 02/05/2017 1401   GLUCOSE 84 02/05/2017 1401   BUN 10 02/05/2017 1401   CREATININE 0.95 02/05/2017 1401   CALCIUM 9.4 02/05/2017 1401    BNP No results found for: "BNP"  ProBNP No results found for: "PROBNP"  Imaging: No results found.   Assessment & Plan:   Loud snoring - Patient has symptoms of loud snoring and witnessed apnea.  Epworth score is 10.  BMI 38.  Concern patient has underlying obstructive sleep apnea, needs home sleep study to evaluate.  We reviewed risks of untreated sleep apnea including cardiac arrhythmias, pulmonary hypertension, diabetes and stroke.  We discussed treatment options including weight loss, oral appliance, CPAP therapy or referral to ENT for possible surgical options.  Encourage side sleeping position.  Advised against alcohol use prior to bedtime as this can worsen sleep apnea.  Follow-up 1 to 2 weeks after sleep study review results and treatment options if needed.   Glenford Bayley, NP 11/22/2022

## 2022-11-25 NOTE — Progress Notes (Signed)
Reviewed and agree with assessment/plan.   Allanah Mcfarland, MD [facility] 11/25/2022, 8:25 AM Pager:  [phone]
